# Patient Record
Sex: Male | Born: 1995 | Race: White | Hispanic: No | Smoking: Never smoker
Health system: Southern US, Community
[De-identification: ages and names within clinical notes are randomized; demographics above are authoritative.]

## PROBLEM LIST (undated history)

## (undated) DIAGNOSIS — B001 Herpesviral vesicular dermatitis: Secondary | ICD-10-CM

## (undated) HISTORY — DX: Herpesviral vesicular dermatitis: B00.1

---
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
## 2018-12-18 ENCOUNTER — Other Ambulatory Visit: Payer: Self-pay

## 2018-12-18 DIAGNOSIS — Z20822 Contact with and (suspected) exposure to covid-19: Secondary | ICD-10-CM

## 2018-12-20 LAB — NOVEL CORONAVIRUS, NAA: SARS-CoV-2, NAA: NOT DETECTED

## 2019-01-13 ENCOUNTER — Encounter: Payer: Self-pay | Admitting: Family Medicine

## 2019-01-13 ENCOUNTER — Other Ambulatory Visit: Payer: Self-pay

## 2019-01-13 ENCOUNTER — Ambulatory Visit: Payer: BC Managed Care – PPO | Admitting: Family Medicine

## 2019-01-13 VITALS — BP 124/76 | HR 78 | Temp 99.1°F | Ht 71.25 in | Wt 204.5 lb

## 2019-01-13 DIAGNOSIS — Z23 Encounter for immunization: Secondary | ICD-10-CM | POA: Diagnosis not present

## 2019-01-13 DIAGNOSIS — Z0000 Encounter for general adult medical examination without abnormal findings: Secondary | ICD-10-CM

## 2019-01-13 NOTE — Progress Notes (Signed)
Annual Exam   Chief Complaint:  Chief Complaint  Patient presents with  . Establish Care    previous PCP Dr. Truddie Coco in [place]    History of Present Illness:  Chad Charles is a 23 y.o. presents today for annual examination.     Nutrition/Lifestyle Diet: well rounded Exercise: beach body video He is single partner, contraception - condoms most of the time.   Social History   Tobacco Use  Smoking Status Never Smoker  Smokeless Tobacco Never Used   Social History   Substance and Sexual Activity  Alcohol Use Yes   Comment: on the weekend, 3-6 in a setting   Social History   Substance and Sexual Activity  Drug Use Never     Safety The patient wears seatbelts: yes.     The patient feels safe at home and in their relationships: yes.  General Health Dentist in the last year: Yes Eye doctor: not applicable  Weight Wt Readings from Last 3 Encounters:  01/13/19 204 lb 8 oz (92.8 kg)   Patient has high BMI  BMI Readings from Last 1 Encounters:  01/13/19 28.32 kg/m     Chronic disease screening Blood pressure monitoring:  BP Readings from Last 3 Encounters:  01/13/19 124/76    Lipid Monitoring: Indication for screening: age >35, obesity, diabetes, family hx, CV risk factors.  Lipid screening: Not Indicated  No results found for: CHOL, HDL, LDLCALC, LDLDIRECT, TRIG, CHOLHDL   Diabetes Screening: age >49, overweight, family hx, PCOS, hx of gestational diabetes, at risk ethnicity, elevated blood pressure >135/80.  Diabetes Screening screening: Not Indicated  No results found for: HGBA1C   Immunization History  Administered Date(s) Administered  . Influenza,inj,Quad PF,6+ Mos 01/13/2019  . Tdap 01/13/2019    Past Medical History:  Diagnosis Date  . Recurrent cold sores      Prior to Admission medications   Medication Sig Start Date End Date Taking? Authorizing Provider  valACYclovir (VALTREX) 1000 MG tablet Take 500 mg by mouth daily.  09/06/18   [provider]    Allergies  Allergen Reactions  . Amoxicillin Hives     Social History   Socioeconomic History  . Marital status: Significant Other    Spouse name: [place] Miners  . Number of children: 0  . Years of education: college  . Highest education level: Not on file  Occupational History  . Occupation: Product manager: [employer]  . Financial resource strain: Not hard at all  . Food insecurity    Worry: Not on file    Inability: Not on file  . Transportation needs    Medical: Not on file    Non-medical: Not on file  Tobacco Use  . Smoking status: Never Smoker  . Smokeless tobacco: Never Used  Substance and Sexual Activity  . Alcohol use: Yes    Comment: on the weekend, 3-6 in a setting  . Drug use: Never  . Sexual activity: Yes    Birth control/protection: Condom  Lifestyle  . Physical activity    Days per week: Not on file    Minutes per session: Not on file  . Stress: Not on file  Relationships  . Social Herbalist on phone: Not on file    Gets together: Not on file    Attends religious service: Not on file    Active member of club or organization: Not on file    Attends meetings of  clubs or organizations: Not on file    Relationship status: Not on file  . Intimate partner violence    Fear of current or ex partner: Not on file    Emotionally abused: Not on file    Physically abused: Not on file    Forced sexual activity: Not on file  Other Topics Concern  . Not on file  Social History Narrative   01/13/19   From: the area   Living: with parents, engaged to [place]    Work: teaches business at [employer]      Family: gets along with parents, getting married June 2021      Enjoys: fishing      Exercise: Counsellor work-outs   Diet: normal - fruits/veggies/meats      Safety   Seat belts: Yes    Guns: Yes  and secure   Safe in relationships: Yes     Family History  Problem  Relation Age of Onset  . Hypertension Father   . Heart attack Maternal Grandfather   . Parkinson's disease Maternal Grandfather   . Diabetes Maternal Grandfather     Review of Systems  Constitutional: Negative for chills and fever.  HENT: Negative for congestion and sore throat.   Eyes: Negative.   Respiratory: Negative for cough and shortness of breath.   Cardiovascular: Negative for chest pain.  Gastrointestinal: Negative for heartburn, nausea and vomiting.  Genitourinary: Negative.   Musculoskeletal: Negative.   Skin: Negative.   Neurological: Negative for dizziness and headaches.  Endo/Heme/Allergies: Negative.   Psychiatric/Behavioral: Negative for depression. The patient is not nervous/anxious.      Physical Exam BP 124/76   Pulse 78   Temp 99.1 F (37.3 C)   Ht 5' 11.25" (1.81 m)   Wt 204 lb 8 oz (92.8 kg)   SpO2 97%   BMI 28.32 kg/m    BP Readings from Last 3 Encounters:  01/13/19 124/76      Physical Exam Constitutional:      General: He is not in acute distress.    Appearance: He is well-developed. He is not diaphoretic.  HENT:     Head: Normocephalic and atraumatic.     Right Ear: Tympanic membrane and ear canal normal.     Left Ear: Tympanic membrane and ear canal normal.     Nose: Nose normal.     Mouth/Throat:     Pharynx: Uvula midline.  Eyes:     General: No scleral icterus.    Conjunctiva/sclera: Conjunctivae normal.     Pupils: Pupils are equal, round, and reactive to light.  Neck:     Musculoskeletal: Normal range of motion and neck supple.  Cardiovascular:     Rate and Rhythm: Normal rate and regular rhythm.     Heart sounds: Normal heart sounds. No murmur.  Pulmonary:     Effort: Pulmonary effort is normal. No respiratory distress.     Breath sounds: Normal breath sounds. No wheezing.  Abdominal:     General: Bowel sounds are normal. There is no distension.     Palpations: Abdomen is soft. There is no mass.     Tenderness: There is  no abdominal tenderness. There is no guarding.  Musculoskeletal: Normal range of motion.  Lymphadenopathy:     Cervical: No cervical adenopathy.  Skin:    General: Skin is warm and dry.     Capillary Refill: Capillary refill takes less than 2 seconds.  Neurological:     Mental Status:  He is alert and oriented to person, place, and time.        Results:  PHQ-9:  low risk    Assessment: 23 y.o. here for routine annual physical examination.  Plan: Problem List Items Addressed This Visit    None    Visit Diagnoses    Annual physical exam    -  Primary   Need for Tdap vaccination       Relevant Orders   Flu Vaccine QUAD 6+ mos PF IM (Fluarix Quad PF) (Completed)   Need for influenza vaccination       Relevant Orders   Tdap vaccine greater than or equal to 7yo IM (Completed)      Screening: -- Blood pressure screen normal -- cholesterol screening: not due for screening -- Weight screening: slightly overweight, discussed maintaining weight and avoiding gaining -- Diabetes Screening: not due for screening -- Nutrition: normal - continue healthy eating  The ASCVD Risk score Denman George(Goff DC Jr., et al., 2013) failed to calculate for the following reasons:   The 2013 ASCVD risk score is only valid for ages 8640 to 8079  -- Statin therapy for Age 25-75 with CVD risk >7.5%  Psych -- Depression screening (PHQ-9): low risk   Safety -- tobacco screening: not using -- alcohol screening:discussed risks of binge drinking and advised reducing to recommended limits -- no evidence of domestic violence or intimate partner violence.   Cancer Screening -- No age related cancer screening due  Immunizations -- flu vaccine up to date -- TDAP q10 years up to date  Advised regular dental care Regular exercise and healthy eating    Lynnda ChildJessica R

## 2019-01-13 NOTE — Patient Instructions (Signed)
Preventive Care 19-25 Years Old, Male Preventive care refers to lifestyle choices and visits with your health care provider that can promote health and wellness. This includes:  A yearly physical exam. This is also called an annual well check.  Regular dental and eye exams.  Immunizations.  Screening for certain conditions.  Healthy lifestyle choices, such as eating a healthy diet, getting regular exercise, not using drugs or products that contain nicotine and tobacco, and limiting alcohol use. What can I expect for my preventive care visit? Physical exam Your health care provider will check:  Height and weight. These may be used to calculate body mass index (BMI), which is a measurement that tells if you are at a healthy weight.  Heart rate and blood pressure.  Your skin for abnormal spots. Counseling Your health care provider may ask you questions about:  Alcohol, tobacco, and drug use.  Emotional well-being.  Home and relationship well-being.  Sexual activity.  Eating habits.  Work and work Statistician. What immunizations do I need?  Influenza (flu) vaccine  This is recommended every year. Tetanus, diphtheria, and pertussis (Tdap) vaccine  You may need a Td booster every 10 years. Varicella (chickenpox) vaccine  You may need this vaccine if you have not already been vaccinated. Human papillomavirus (HPV) vaccine  If recommended by your health care provider, you may need three doses over 6 months. Measles, mumps, and rubella (MMR) vaccine  You may need at least one dose of MMR. You may also need a second dose. Meningococcal conjugate (MenACWY) vaccine  One dose is recommended if you are 45-76 years old and a Market researcher living in a residence hall, or if you have one of several medical conditions. You may also need additional booster doses. Pneumococcal conjugate (PCV13) vaccine  You may need this if you have certain conditions and were not  previously vaccinated. Pneumococcal polysaccharide (PPSV23) vaccine  You may need one or two doses if you smoke cigarettes or if you have certain conditions. Hepatitis A vaccine  You may need this if you have certain conditions or if you travel or work in places where you may be exposed to hepatitis A. Hepatitis B vaccine  You may need this if you have certain conditions or if you travel or work in places where you may be exposed to hepatitis B. Haemophilus influenzae type b (Hib) vaccine  You may need this if you have certain risk factors. You may receive vaccines as individual doses or as more than one vaccine together in one shot (combination vaccines). Talk with your health care provider about the risks and benefits of combination vaccines. What tests do I need? Blood tests  Lipid and cholesterol levels. These may be checked every 5 years starting at age 17.  Hepatitis C test.  Hepatitis B test. Screening   Diabetes screening. This is done by checking your blood sugar (glucose) after you have not eaten for a while (fasting).  Sexually transmitted disease (STD) testing. Talk with your health care provider about your test results, treatment options, and if necessary, the need for more tests. Follow these instructions at home: Eating and drinking   Eat a diet that includes fresh fruits and vegetables, whole grains, lean protein, and low-fat dairy products.  Take vitamin and mineral supplements as recommended by your health care provider.  Do not drink alcohol if your health care provider tells you not to drink.  If you drink alcohol: ? Limit how much you have to 0-2  drinks a day. ? Be aware of how much alcohol is in your drink. In the [place] one drink equals one 12 oz bottle of beer (355 mL), one 5 oz glass of wine (148 mL), or one 1 oz glass of hard liquor (44 mL). Lifestyle  Take daily care of your teeth and gums.  Stay active. Exercise for at least 30 minutes on 5 or  more days each week.  Do not use any products that contain nicotine or tobacco, such as cigarettes, e-cigarettes, and chewing tobacco. If you need help quitting, ask your health care provider.  If you are sexually active, practice safe sex. Use a condom or other form of protection to prevent STIs (sexually transmitted infections). What's next?  Go to your health care provider once a year for a well check visit.  Ask your health care provider how often you should have your eyes and teeth checked.  Stay up to date on all vaccines. This information is not intended to replace advice given to you by your health care provider. Make sure you discuss any questions you have with your health care provider. Document Released: 04/10/2001 Document Revised: 02/06/2018 Document Reviewed: 02/06/2018 [employer] Patient Education  2020 [employer]  

## 2019-02-20 ENCOUNTER — Encounter: Payer: Self-pay | Admitting: Family Medicine

## 2019-02-23 NOTE — Telephone Encounter (Signed)
Dr. Einar Pheasant does patient need an appointment first?

## 2021-04-25 ENCOUNTER — Encounter: Payer: Self-pay | Admitting: Family Medicine

## 2021-04-25 DIAGNOSIS — Z3141 Encounter for fertility testing: Secondary | ICD-10-CM
